# Patient Record
Sex: Female | Born: 1975 | Race: Black or African American | Hispanic: No | Marital: Married | Smoking: Never smoker
Health system: Southern US, Community
[De-identification: ages and names within clinical notes are randomized; demographics above are authoritative.]

## PROBLEM LIST (undated history)

## (undated) ENCOUNTER — Emergency Department (HOSPITAL_COMMUNITY): Payer: Self-pay | Source: Home / Self Care

## (undated) DIAGNOSIS — K7689 Other specified diseases of liver: Secondary | ICD-10-CM

## (undated) DIAGNOSIS — O24419 Gestational diabetes mellitus in pregnancy, unspecified control: Secondary | ICD-10-CM

## (undated) DIAGNOSIS — N858 Other specified noninflammatory disorders of uterus: Secondary | ICD-10-CM

## (undated) HISTORY — PX: CHOLECYSTECTOMY: SHX55

---

## 2005-09-13 ENCOUNTER — Observation Stay (HOSPITAL_COMMUNITY): Admission: RE | Admit: 2005-09-13 | Discharge: 2005-09-13 | Payer: Self-pay | Admitting: Obstetrics and Gynecology

## 2005-10-03 ENCOUNTER — Ambulatory Visit (HOSPITAL_COMMUNITY): Admission: AD | Admit: 2005-10-03 | Discharge: 2005-10-03 | Payer: Self-pay | Admitting: Obstetrics and Gynecology

## 2007-07-06 ENCOUNTER — Emergency Department (HOSPITAL_COMMUNITY): Admission: EM | Admit: 2007-07-06 | Discharge: 2007-07-06 | Payer: Self-pay | Admitting: Emergency Medicine

## 2007-08-25 ENCOUNTER — Encounter (HOSPITAL_COMMUNITY): Admission: RE | Admit: 2007-08-25 | Discharge: 2007-09-24 | Payer: Self-pay | Admitting: Preventative Medicine

## 2008-05-30 ENCOUNTER — Emergency Department (HOSPITAL_COMMUNITY): Admission: EM | Admit: 2008-05-30 | Discharge: 2008-05-30 | Payer: Self-pay | Admitting: Emergency Medicine

## 2010-01-30 ENCOUNTER — Emergency Department (HOSPITAL_COMMUNITY): Admission: EM | Admit: 2010-01-30 | Discharge: 2010-01-30 | Payer: Self-pay | Admitting: Emergency Medicine

## 2010-06-05 LAB — URINE MICROSCOPIC-ADD ON

## 2010-06-05 LAB — URINALYSIS, ROUTINE W REFLEX MICROSCOPIC
Glucose, UA: NEGATIVE mg/dL
Hgb urine dipstick: NEGATIVE
Specific Gravity, Urine: 1.025 (ref 1.005–1.030)
pH: 6.5 (ref 5.0–8.0)

## 2010-06-05 LAB — PREGNANCY, URINE: Preg Test, Ur: NEGATIVE

## 2010-06-05 LAB — GC/CHLAMYDIA PROBE AMP, GENITAL
Chlamydia, DNA Probe: NEGATIVE
GC Probe Amp, Genital: NEGATIVE

## 2010-06-05 LAB — WET PREP, GENITAL
Trich, Wet Prep: NONE SEEN
WBC, Wet Prep HPF POC: NONE SEEN

## 2010-07-05 LAB — WET PREP, GENITAL
Clue Cells Wet Prep HPF POC: NONE SEEN
Trich, Wet Prep: NONE SEEN

## 2010-07-05 LAB — PREGNANCY, URINE: Preg Test, Ur: POSITIVE

## 2010-07-05 LAB — URINALYSIS, ROUTINE W REFLEX MICROSCOPIC
Glucose, UA: NEGATIVE mg/dL
Hgb urine dipstick: NEGATIVE
Ketones, ur: NEGATIVE mg/dL
Nitrite: NEGATIVE
Protein, ur: NEGATIVE mg/dL
Urobilinogen, UA: 0.2 mg/dL (ref 0.0–1.0)
pH: 5.5 (ref 5.0–8.0)

## 2010-07-05 LAB — GC/CHLAMYDIA PROBE AMP, GENITAL: Chlamydia, DNA Probe: NEGATIVE

## 2010-07-05 LAB — RPR: RPR Ser Ql: NONREACTIVE

## 2010-07-05 LAB — SAMPLE TO BLOOD BANK

## 2010-08-10 NOTE — H&P (Signed)
NAMEMIAA, LATTERELL           ACCOUNT NO.:  [id number]   MEDICAL RECORD NO.:  [id number]          PATIENT TYPE:  OIB   LOCATION:  LDR4                          FACILITY:  [facility]   PHYSICIAN:  Tilda Burrow, M.D. DATE OF BIRTH:  09-24-1975   DATE OF ADMISSION:  09/13/2005  DATE OF DISCHARGE:  LH                                HISTORY & PHYSICAL   ADMITTING DIAGNOSIS:  Pregnancy, [redacted] weeks gestation.  Abdominal pain, rule  out preterm labor.   TIME OF ADMISSION:  6 a.m.   OBSERVATION TIME:  12 hours.   OBSERVATION SUMMARY:  This 35 year old gravida 2, para 1 at 30+ weeks  gestation was seen after presenting from her work site where she had been  contracting since midnight.  She works third shift.  She presents with mild  contractions noted, perceived as uncomfortable to the patient.  Cervical  exam is closed, internal os, soft external os.  Vertex presenting part  confirmed.   HOSPITAL COURSE:  The patient was observed through the day and ended up with  receiving terbutaline subcutaneously x2, followed by oral terbutaline.  She  had resolution with contractions.  Ultrasound performed showed estimated  fetal weight of 1774 gm, between and 50th and 95th percentile at [redacted] weeks  gestation.  Fetal survey was negative.  Cervical length was 1.7 cm on  transabdominal ultrasound.  The cervix was described as slightly dilated  with fluid containment within.  This patient was felt stable for discharge  at 6 p.m. for follow up next week with Dr. Wallace Cullens in [place].  A phone call  to the covering physician was performed on Friday afternoon to confirm  management plan, which consists of Brethine 5 mg p.o. q.6h. throughout the  weekend, and then as needed.  Follow up the first of the week with Dr.  Wallace Cullens.      Tilda Burrow, M.D.  Electronically Signed     JVF/MEDQ  D:  09/13/2005  T:  09/13/2005  Job:  [id number]

## 2010-08-10 NOTE — Group Therapy Note (Signed)
NAMEGURTHA, PICKER           ACCOUNT NO.:  [id number]   MEDICAL RECORD NO.:  [id number]          PATIENT TYPE:  OIB   LOCATION:  LDR5                          FACILITY:  [facility]   PHYSICIAN:  Tilda Burrow, M.D. DATE OF BIRTH:  [birth year]   DATE OF PROCEDURE:  DATE OF DISCHARGE:                                   PROGRESS NOTE   Jillian Berry is a patient of [place]'s.  Came into labor and delivery via EMS  with complaints of having to push.  She is [redacted] weeks pregnant.  She was 1 cm,  50% effaced, -1 station per R.N. examination.  She has been on terbutaline  about every four to six hours.  She had taken one a little while earlier.  However, while she was at work she felt like she was having contractions and  about to deliver.  Upon realizing that she was not about to deliver and some  calming down by the nurse she calmed down immediately.  No uterine activity  was noted.  She feels like maybe her terbutaline just kicked in.  She had  complained of some leaking of fluid.  A sterile speculum examination  revealed yeasty discharge which was positive yeast on a wet prep, negative  fern on a slide.   IMPRESSION:  1.  Intrauterine pregnancy at 32 weeks.  2.  Preterm contractions resolved with p.o. terbutaline.  3.  Yeast infection.   PLAN:  Patient is discharged home with a prescription for Diflucan and to  follow up with her obstetrician.      Jacklyn Shell, C.N.M.      Tilda Burrow, M.D.  Electronically Signed    FC/MEDQ  D:  10/03/2005  T:  10/03/2005  Job:  [id number]   cc:   [facility]

## 2012-02-12 ENCOUNTER — Encounter (HOSPITAL_COMMUNITY): Payer: Self-pay | Admitting: *Deleted

## 2012-02-12 ENCOUNTER — Emergency Department (HOSPITAL_COMMUNITY)
Admission: EM | Admit: 2012-02-12 | Discharge: 2012-02-12 | Disposition: A | Discharge status: Home / Self Care | Payer: 59 | Attending: Emergency Medicine | Admitting: Emergency Medicine

## 2012-02-12 DIAGNOSIS — R61 Generalized hyperhidrosis: Secondary | ICD-10-CM | POA: Insufficient documentation

## 2012-02-12 DIAGNOSIS — R55 Syncope and collapse: Secondary | ICD-10-CM | POA: Insufficient documentation

## 2012-02-12 DIAGNOSIS — E162 Hypoglycemia, unspecified: Secondary | ICD-10-CM | POA: Insufficient documentation

## 2012-02-12 DIAGNOSIS — R1110 Vomiting, unspecified: Secondary | ICD-10-CM | POA: Insufficient documentation

## 2012-02-12 HISTORY — DX: Gestational diabetes mellitus in pregnancy, unspecified control: O24.419

## 2012-02-12 LAB — CBC WITH DIFFERENTIAL/PLATELET
Basophils Absolute: 0 10*3/uL (ref 0.0–0.1)
Basophils Relative: 0 % (ref 0–1)
Eosinophils Absolute: 0.1 10*3/uL (ref 0.0–0.7)
Eosinophils Relative: 1 % (ref 0–5)
MCH: 28.1 pg (ref 26.0–34.0)
Monocytes Absolute: 0.6 10*3/uL (ref 0.1–1.0)
Neutrophils Relative %: 61 % (ref 43–77)

## 2012-02-12 LAB — BASIC METABOLIC PANEL
BUN: 12 mg/dL (ref 6–23)
Calcium: 9.5 mg/dL (ref 8.4–10.5)
Chloride: 104 mEq/L (ref 96–112)
Creatinine, Ser: 0.69 mg/dL (ref 0.50–1.10)
GFR calc Af Amer: >90 mL/min (ref >90)
GFR calc non Af Amer: >90 mL/min (ref >90)
Glucose, Bld: 67 mg/dL — ABNORMAL LOW (ref 70–99)
Sodium: 139 mEq/L (ref 135–145)

## 2012-02-12 LAB — PREGNANCY, URINE: Preg Test, Ur: NEGATIVE

## 2012-02-12 LAB — URINALYSIS, ROUTINE W REFLEX MICROSCOPIC
Glucose, UA: NEGATIVE mg/dL
Hgb urine dipstick: NEGATIVE
Leukocytes, UA: NEGATIVE
Nitrite: NEGATIVE
Protein, ur: NEGATIVE mg/dL
Specific Gravity, Urine: 1.02 (ref 1.005–1.030)
pH: 7 (ref 5.0–8.0)

## 2012-02-12 MED ORDER — SODIUM CHLORIDE 0.9 % IV BOLUS (SEPSIS)
1000.0000 mL | Freq: Once | INTRAVENOUS | Status: AC
  Administered 2012-02-12: 1000 mL via INTRAVENOUS

## 2012-02-12 NOTE — Discharge Instructions (Signed)
Tests were normal with the exception of glucose was low.   You must eat regular frequent meals including breakfast

## 2012-02-12 NOTE — ED Provider Notes (Signed)
History   This chart was scribed for Jillian Hutching, MD by Thad Ranger, ED Scribe. This patient was seen in room APA11/APA11 and the patient's care was started at 12:42 PM.  CSN: [id number]  Arrival date & time 02/12/12  1157   None     Chief Complaint  Patient presents with  . Weakness   The history is provided by the patient and a friend. No language interpreter was used.   Jillian Berry is a 36 y.o. female with a history of gestational diabetes who presents to the Emergency Department complaining of sudden, moderate to severe, intermittent episodes of generalized weakness and dizziness onset 1 week ago. There is associated vomiting, HA, and excessive sweating. She states that she fell last week when her symptoms first started. Patient states that she had similar symptoms today and she fell at work, she was told by the nurse to have some crackers otherwise she did not eat anything else. She denies having breakfast in the mornings and her first meal is usually lunch around 2 pm. Patient otherwise is healthy and denies any other symptoms.    Past Medical History  Diagnosis Date  . Gestational diabetes     History reviewed. No pertinent past surgical history.  History reviewed. No pertinent family history.  History  Substance Use Topics  . Smoking status: Never Smoker   . Smokeless tobacco: Not on file  . Alcohol Use: No    No OB history provided.  Review of Systems  A complete 10 system review of systems was obtained and all systems are negative except as noted in the HPI and PMH.   Allergies  Flagyl and Penicillins  Home Medications  No current outpatient prescriptions on file.  BP 121/85  Pulse 67  Temp 97.7 F (36.5 C) (Oral)  Resp 20  Ht 5\' 5"  (1.651 m)  Wt 180 lb (81.647 kg)  BMI 29.95 kg/m2  SpO2 100%  LMP 01/12/2012  Physical Exam  Nursing note and vitals reviewed. Constitutional: She is oriented to person, place, and time. She appears  well-developed and well-nourished.  HENT:  Head: Normocephalic and atraumatic.  Eyes: Conjunctivae normal and EOM are normal. Pupils are equal, round, and reactive to light.  Neck: Normal range of motion. Neck supple.  Cardiovascular: Normal rate, regular rhythm and normal heart sounds.   Pulmonary/Chest: Effort normal and breath sounds normal.  Abdominal: Soft. Bowel sounds are normal.  Musculoskeletal: Normal range of motion.  Neurological: She is alert and oriented to person, place, and time.  Skin: Skin is warm and dry.  Psychiatric: She has a normal mood and affect.    ED Course  Procedures (including critical care time)  DIAGNOSTIC STUDIES: Oxygen Saturation is 100% on room air, normal by my interpretation.    COORDINATION OF CARE: 12:59 PM Discussed the possibility of having syncope and also discussed treatment plan which includes EKG and blood work with pt at bedside and pt agreed to plan.  Labs Reviewed  BASIC METABOLIC PANEL - Abnormal; Notable for the following:    Glucose, Bld 67 (*)     All other components within normal limits  CBC WITH DIFFERENTIAL  URINALYSIS, ROUTINE W REFLEX MICROSCOPIC  PREGNANCY, URINE  GLUCOSE, CAPILLARY   No results found.   No diagnosis found.   Date: 02/12/2012  Rate: 69  Rhythm: normal sinus rhythm  QRS Axis: normal  Intervals: normal  ST/T Wave abnormalities: normal  Conduction Disutrbances: none  Narrative Interpretation: unremarkable  c SA   MDM  History and physical exam normal. EKG normal. Screening labs showed glucose of 67. Patient does not eat  breakfast. I recommended she eat regular frequent meals and increase fluids      I personally performed the services described in this documentation, which was scribed in my presence. The recorded information has been reviewed and is accurate.     Jillian Hutching, MD 02/12/12 1452

## 2012-02-12 NOTE — ED Notes (Signed)
Dizziness, weakness, had onset app 1 week ago, felt weak, vomiting, and fell to floor.  Today at work had dizziness again and nurse checked her cbg, and gave her peanut butter.  Headache, and dizziness now.  Had gestestional diabetes.

## 2013-11-18 ENCOUNTER — Other Ambulatory Visit (HOSPITAL_COMMUNITY): Payer: Self-pay | Admitting: Physician Assistant

## 2013-11-18 DIAGNOSIS — N949 Unspecified condition associated with female genital organs and menstrual cycle: Secondary | ICD-10-CM

## 2013-11-23 ENCOUNTER — Ambulatory Visit (HOSPITAL_COMMUNITY)
Admission: RE | Admit: 2013-11-23 | Discharge: 2013-11-23 | Disposition: A | Discharge status: Home / Self Care | Payer: BC Managed Care – PPO | Source: Ambulatory Visit | Attending: Physician Assistant | Admitting: Physician Assistant

## 2013-11-23 ENCOUNTER — Other Ambulatory Visit (HOSPITAL_COMMUNITY): Payer: Self-pay | Admitting: Physician Assistant

## 2013-11-23 DIAGNOSIS — N949 Unspecified condition associated with female genital organs and menstrual cycle: Secondary | ICD-10-CM | POA: Insufficient documentation

## 2013-12-02 ENCOUNTER — Other Ambulatory Visit (HOSPITAL_COMMUNITY): Payer: Self-pay | Admitting: Physician Assistant

## 2013-12-02 DIAGNOSIS — N9489 Other specified conditions associated with female genital organs and menstrual cycle: Secondary | ICD-10-CM

## 2013-12-06 ENCOUNTER — Ambulatory Visit (HOSPITAL_COMMUNITY)
Admission: RE | Admit: 2013-12-06 | Discharge: 2013-12-06 | Disposition: A | Discharge status: Home / Self Care | Payer: BC Managed Care – PPO | Source: Ambulatory Visit | Attending: Physician Assistant | Admitting: Physician Assistant

## 2013-12-06 DIAGNOSIS — N9489 Other specified conditions associated with female genital organs and menstrual cycle: Secondary | ICD-10-CM

## 2013-12-06 DIAGNOSIS — K7689 Other specified diseases of liver: Secondary | ICD-10-CM | POA: Diagnosis not present

## 2013-12-06 DIAGNOSIS — N838 Other noninflammatory disorders of ovary, fallopian tube and broad ligament: Secondary | ICD-10-CM | POA: Diagnosis not present

## 2013-12-06 DIAGNOSIS — R109 Unspecified abdominal pain: Secondary | ICD-10-CM | POA: Diagnosis present

## 2013-12-06 MED ORDER — IOHEXOL 300 MG/ML  SOLN
100.0000 mL | Freq: Once | INTRAMUSCULAR | Status: AC | PRN
  Administered 2013-12-06: 100 mL via INTRAVENOUS

## 2015-09-11 ENCOUNTER — Emergency Department (HOSPITAL_COMMUNITY): Payer: BLUE CROSS/BLUE SHIELD

## 2015-09-11 ENCOUNTER — Encounter (HOSPITAL_COMMUNITY): Payer: Self-pay | Admitting: Emergency Medicine

## 2015-09-11 ENCOUNTER — Emergency Department (HOSPITAL_COMMUNITY)
Admission: EM | Admit: 2015-09-11 | Discharge: 2015-09-12 | Discharge status: Against Medical Advice | Payer: BLUE CROSS/BLUE SHIELD | Attending: Emergency Medicine | Admitting: Emergency Medicine

## 2015-09-11 DIAGNOSIS — R109 Unspecified abdominal pain: Secondary | ICD-10-CM

## 2015-09-11 DIAGNOSIS — R1084 Generalized abdominal pain: Secondary | ICD-10-CM | POA: Insufficient documentation

## 2015-09-11 DIAGNOSIS — K5900 Constipation, unspecified: Secondary | ICD-10-CM | POA: Insufficient documentation

## 2015-09-11 HISTORY — DX: Other specified diseases of liver: K76.89

## 2015-09-11 HISTORY — DX: Other specified noninflammatory disorders of uterus: N85.8

## 2015-09-11 LAB — URINALYSIS, ROUTINE W REFLEX MICROSCOPIC
GLUCOSE, UA: NEGATIVE mg/dL
Hgb urine dipstick: NEGATIVE
KETONES UR: NEGATIVE mg/dL
LEUKOCYTES UA: NEGATIVE
Nitrite: NEGATIVE
PH: 6 (ref 5.0–8.0)
Protein, ur: 30 mg/dL — AB
Specific Gravity, Urine: >1.03 — ABNORMAL HIGH (ref 1.005–1.030)

## 2015-09-11 LAB — URINE MICROSCOPIC-ADD ON

## 2015-09-11 LAB — POC URINE PREG, ED: Preg Test, Ur: NEGATIVE

## 2015-09-11 NOTE — ED Notes (Signed)
Pt c/o constipation with a small bowel movement yesterday. Pt has history of the same.

## 2015-09-11 NOTE — ED Notes (Signed)
Pt c/o abd pain/n and constipation x 2 weeks.

## 2015-09-11 NOTE — ED Provider Notes (Signed)
History  By signing my name below, I, Earmon Phoenix, attest that this documentation has been prepared under the direction and in the presence of Zadie Rhine, MD. Electronically Signed: Earmon Phoenix, ED Scribe. 09/11/2015. 11:27 AM.  Chief Complaint  Patient presents with  . Abdominal Pain   Patient is a 40 y.o. female presenting with abdominal pain. The history is provided by the patient and medical records. No language interpreter was used.  Abdominal Pain Pain location:  Generalized Pain quality: fullness   Pain quality comment:  Tightness Pain radiates to:  Does not radiate Pain severity:  Moderate Onset quality:  Gradual Duration:  2 weeks Timing:  Constant Progression:  Unchanged Chronicity:  Recurrent Context: previous surgery   Relieved by:  Nothing Ineffective treatments:  Bowel activity Associated symptoms: constipation   Risk factors: obesity     HPI Comments:  Jillian Berry is a 40 y.o. obese female who presents to the Emergency Department complaining of lower abdominal pain and upper abdominal tightness that began about two weeks ago. She reports associated constipation, nausea and bloating. Pt reports having a cholecystectomy about 1.5 years ago and tried to contact her Careers adviser. She reports having a bowel movement about three days ago but states it was really hard in consistency and small in amount. She reports taking OTC stool softeners with no significant relief of the symptoms. She denies modifying factors. She denies fever, vomiting, dysuria, vaginal bleeding or discharge, cough. She reports she has cysts on her ovaries and one on her liver. She denies any other abdominal surgeries.   Past Medical History  Diagnosis Date  . Liver cyst   . Uterine cyst    Past Surgical History  Procedure Laterality Date  . Cholecystectomy     No family history on file. Social History  Substance Use Topics  . Smoking status: Never Smoker   . Smokeless  tobacco: None  . Alcohol Use: No   OB History    No data available     Review of Systems  Gastrointestinal: Positive for abdominal pain and constipation.  All other systems reviewed and are negative.   Allergies  Flagyl and Penicillins  Home Medications   Prior to Admission medications   Not on File   Triage Vitals: BP 134/89 mmHg  Pulse 75  Temp(Src) 98.5 F (36.9 C)  Resp 18  Ht  (1.651 m)  Wt 200 lb (90.719 kg)  BMI 33.28 kg/m2  SpO2 99%  LMP 08/27/2015  Physical Exam  CONSTITUTIONAL: Well developed/well nourished HEAD: Normocephalic/atraumatic EYES: EOMI ENMT: Mucous membranes moist NECK: supple no meningeal signs SPINE/BACK:entire spine nontender CV: S1/S2 noted, no murmurs/rubs/gallops noted LUNGS: Lungs are clear to auscultation bilaterally, no apparent distress ABDOMEN: soft, nontender, no rebound or guarding, bowel sounds noted throughout abdomen GU:no cva tenderness; pt declines rectal exam NEURO: Pt is awake/alert/appropriate, moves all extremitiesx4.  No facial droop.   EXTREMITIES: pulses normal/equal, full ROM SKIN: warm, color normal PSYCH: no abnormalities of mood noted, alert and oriented to situation  ED Course  Procedures  DIAGNOSTIC STUDIES: Oxygen Saturation is 99% on RA, normal by my interpretation.   COORDINATION OF CARE: 10:38 AM- Will order imaging, urinalysis. Offered to perform rectal exam to check for impaction but pt declined. Pt verbalizes understanding and agrees to plan.   Pt left after initial evaluation We were unable to complete abdominal xray Pt was otherwise stable in the ER  Labs Review Labs Reviewed  URINALYSIS, ROUTINE W REFLEX MICROSCOPIC (NOT  AT [facility]) - Abnormal; Notable for the following:    Specific Gravity, Urine >1.030 (*)    Bilirubin Urine SMALL (*)    Protein, ur 30 (*)    All other components within normal limits  URINE MICROSCOPIC-ADD ON - Abnormal; Notable for the following:    Squamous  Epithelial / LPF TOO NUMEROUS TO COUNT (*)    Bacteria, UA MANY (*)    All other components within normal limits  POC URINE PREG, ED    I have personally reviewed and evaluated these lab results as part of my medical decision-making.    MDM   Final diagnoses:  Abdominal pain, unspecified abdominal location    Nursing notes including past medical history and social history reviewed and considered in documentation Labs/vital reviewed myself and considered during evaluation   I personally performed the services described in this documentation, which was scribed in my presence. The recorded information has been reviewed and is accurate.       Zadie Rhineonald Kaelan Emami, MD 09/11/15 1242

## 2015-10-31 ENCOUNTER — Telehealth: Payer: Self-pay | Admitting: Orthopedic Surgery

## 2015-10-31 NOTE — Telephone Encounter (Signed)
Patient called to inquire about scheduling appointment for carpel tunnel, as states she was treated at [facility] urgent care - states it was deemed as NOT workers comp or work-related; also states she was told to have Nerve conduction study in [place], and that it's been done.  Relayed that our office will need a referral from providers who have participated in her care to determine that this is needed, so that we may receive treatment notes and reports.  Appointment pending.

## 2015-11-15 ENCOUNTER — Encounter (HOSPITAL_COMMUNITY): Payer: Self-pay | Admitting: *Deleted

## 2016-10-18 ENCOUNTER — Encounter: Payer: Self-pay | Admitting: Emergency Medicine

## 2016-10-18 ENCOUNTER — Emergency Department (HOSPITAL_COMMUNITY): Payer: BLUE CROSS/BLUE SHIELD

## 2016-10-18 ENCOUNTER — Emergency Department (HOSPITAL_COMMUNITY)
Admission: EM | Admit: 2016-10-18 | Discharge: 2016-10-18 | Disposition: A | Discharge status: Home / Self Care | Payer: BLUE CROSS/BLUE SHIELD | Attending: Emergency Medicine | Admitting: Emergency Medicine

## 2016-10-18 DIAGNOSIS — R55 Syncope and collapse: Secondary | ICD-10-CM | POA: Insufficient documentation

## 2016-10-18 DIAGNOSIS — R42 Dizziness and giddiness: Secondary | ICD-10-CM | POA: Diagnosis not present

## 2016-10-18 DIAGNOSIS — R002 Palpitations: Secondary | ICD-10-CM | POA: Diagnosis not present

## 2016-10-18 LAB — BASIC METABOLIC PANEL
ANION GAP: 9 (ref 5–15)
BUN: 12 mg/dL (ref 6–20)
CHLORIDE: 105 mmol/L (ref 101–111)
CO2: 23 mmol/L (ref 22–32)
CREATININE: 0.75 mg/dL (ref 0.44–1.00)
Calcium: 8.9 mg/dL (ref 8.9–10.3)
GFR calc non Af Amer: >60 mL/min (ref >60)
Glucose, Bld: 108 mg/dL — ABNORMAL HIGH (ref 65–99)
Potassium: 3.8 mmol/L (ref 3.5–5.1)
SODIUM: 137 mmol/L (ref 135–145)

## 2016-10-18 LAB — CBG MONITORING, ED: GLUCOSE-CAPILLARY: 120 mg/dL — AB (ref 65–99)

## 2016-10-18 LAB — TROPONIN I: Troponin I: <0.03 ng/mL (ref <0.03)

## 2016-10-18 LAB — CBC
HCT: 37.5 % (ref 36.0–46.0)
Hemoglobin: 12.2 g/dL (ref 12.0–15.0)
MCH: 27.2 pg (ref 26.0–34.0)
MCHC: 32.5 g/dL (ref 30.0–36.0)
MCV: 83.5 fL (ref 78.0–100.0)
PLATELETS: 229 10*3/uL (ref 150–400)
RBC: 4.49 MIL/uL (ref 3.87–5.11)
RDW: 14.9 % (ref 11.5–15.5)
WBC: 9.2 10*3/uL (ref 4.0–10.5)

## 2016-10-18 LAB — URINALYSIS, ROUTINE W REFLEX MICROSCOPIC
Bilirubin Urine: NEGATIVE
Glucose, UA: NEGATIVE mg/dL
Hgb urine dipstick: NEGATIVE
Ketones, ur: NEGATIVE mg/dL
LEUKOCYTES UA: NEGATIVE
NITRITE: NEGATIVE
PROTEIN: NEGATIVE mg/dL
Specific Gravity, Urine: 1.006 (ref 1.005–1.030)
pH: 6 (ref 5.0–8.0)

## 2016-10-18 LAB — I-STAT BETA HCG BLOOD, ED (MC, WL, AP ONLY)

## 2016-10-18 LAB — D-DIMER, QUANTITATIVE: D-Dimer, Quant: 0.93 ug/mL-FEU — ABNORMAL HIGH (ref 0.00–0.50)

## 2016-10-18 MED ORDER — MECLIZINE HCL 25 MG PO TABS: 25.0000 mg | ORAL_TABLET | Freq: Three times a day (TID) | ORAL | 0 refills | Status: AC | PRN

## 2016-10-18 MED ORDER — SODIUM CHLORIDE 0.9 % IV BOLUS (SEPSIS)
1000.0000 mL | Freq: Once | INTRAVENOUS | Status: AC
  Administered 2016-10-18: 1000 mL via INTRAVENOUS

## 2016-10-18 MED ORDER — ONDANSETRON 4 MG PO TBDP
4.0000 mg | ORAL_TABLET | Freq: Once | ORAL | Status: AC
  Administered 2016-10-18: 4 mg via ORAL
  Filled 2016-10-18: qty 1

## 2016-10-18 MED ORDER — MECLIZINE HCL 12.5 MG PO TABS
25.0000 mg | ORAL_TABLET | Freq: Once | ORAL | Status: AC
  Administered 2016-10-18: 25 mg via ORAL
  Filled 2016-10-18: qty 2

## 2016-10-18 MED ORDER — IOPAMIDOL (ISOVUE-370) INJECTION 76%
100.0000 mL | Freq: Once | INTRAVENOUS | Status: AC | PRN
  Administered 2016-10-18: 100 mL via INTRAVENOUS

## 2016-10-18 MED ORDER — ONDANSETRON 4 MG PO TBDP: 4.0000 mg | ORAL_TABLET | Freq: Three times a day (TID) | ORAL | 0 refills | Status: AC | PRN

## 2016-10-18 NOTE — Discharge Instructions (Signed)
Avoid avoid caffinated products, such as teas, colas, coffee, chocolate. Avoid over the counter cold medicines, herbal or "natural vitamin" products, and illicit drugs because they can contain stimulants.  Call your regular medical doctor today to schedule a follow up appointment within the next 3 days. You may need a home monitor ("Holter monitor") to monitor you for a fast heart rate.  Call the Cardiologist today to schedule a follow up appointment within the next week to further discuss this. Return to the Emergency Department immediately if worsening.

## 2016-10-18 NOTE — ED Notes (Signed)
Patient transported to X-ray 

## 2016-10-18 NOTE — ED Notes (Signed)
Ambulated Pt to bathroom, pt did well on the way to the restoroom, on the way back once standing from toliet pt became tearful and felt nauseated and sick. HR went up to 110. She reports that she feels like she is going to fall and that "I just feel sick, I don't know how, just sick like I may fall"

## 2016-10-18 NOTE — ED Triage Notes (Addendum)
Presents with near syncopal episode-per pt this is the 3rd episode this week, this episode occurred while coming in to work from a break. . PT states she feels her rate go up and race and then she fels faint. HR for EMS is 80s. Normotensive. AT thsi time pt reports she just feels bad. She is tearful and reports that still feels like her hear is racing.

## 2016-10-18 NOTE — ED Provider Notes (Signed)
[facility]-EM[facility]Y DEPT Provider Note   CSN: [id number] Arrival date & time: 10/18/16  1029     History   Chief Complaint Chief Complaint  Patient presents with  . Near Syncope    HPI Jillian Berry is a 41 y.o. female.  HPI  Pt was seen at 1115. Per EMS and pt, c/o gradual onset and persistence of several intermittent episodes of palpitations for the past week. Pt states she "feels her heart race" and then "feels faint." Pt states she had symptoms today while coming into work from a break, and has had the same symptoms "in the middle of the night."  EMS states pt was normotensive and HR 80's. Denies CP/SOB, no cough, no abd pain, no N/V/D, no back pain, no focal motor weakness, no tingling/numbness in extremities.   Past Medical History:  Diagnosis Date  . Gestational diabetes   . Liver cyst   . Uterine cyst     There are no active problems to display for this patient.   Past Surgical History:  Procedure Laterality Date  . CHOLECYSTECTOMY      OB History    No data available       Home Medications    Prior to Admission medications   Medication Sig Start Date End Date Taking? Authorizing Provider  TRI-PREVIFEM 0.18/0.215/0.25 MG-35 MCG tablet Take 1 tablet by mouth daily. 10/03/16  Yes [provider]    Family History History reviewed. No pertinent family history.  Social History Social History  Substance Use Topics  . Smoking status: Never Smoker  . Smokeless tobacco: Not on file  . Alcohol use No     Allergies   Flagyl [metronidazole] and Penicillins   Review of Systems Review of Systems ROS: Statement: All systems negative except as marked or noted in the HPI; Constitutional: Negative for fever and chills. ; ; Eyes: Negative for eye pain, redness and discharge. ; ; ENMT: Negative for ear pain, hoarseness, nasal congestion, sinus pressure and sore throat. ; ; Cardiovascular: +palpitations. Negative for chest pain, diaphoresis, dyspnea and  peripheral edema. ; ; Respiratory: Negative for cough, wheezing and stridor. ; ; Gastrointestinal: Negative for nausea, vomiting, diarrhea, abdominal pain, blood in stool, hematemesis, jaundice and rectal bleeding. . ; ; Genitourinary: Negative for dysuria, flank pain and hematuria. ; ; Musculoskeletal: Negative for back pain and neck pain. Negative for swelling and trauma.; ; Skin: Negative for pruritus, rash, abrasions, blisters, bruising and skin lesion.; ; Neuro: +lightheadedness. Negative for headache and neck stiffness. Negative for weakness, altered level of consciousness, altered mental status, extremity weakness, paresthesias, involuntary movement, seizure and syncope.       Physical Exam Updated Vital Signs BP 130/81 (BP Location: Left Arm)   Pulse 81   Temp 98.9 F (37.2 C) (Oral)   Resp (!) 24   Ht 5\' 2"  (1.575 m)   Wt 96.2 kg (212 lb)   LMP 09/19/2016 (Exact Date)   SpO2 100%   BMI 38.78 kg/m   Physical Exam 1120: Physical examination:  Nursing notes reviewed; Vital signs and O2 SAT reviewed;  Constitutional: Well developed, Well nourished, Well hydrated, In no acute distress; Head:  Normocephalic, atraumatic; Eyes: EOMI, PERRL, No scleral icterus; ENMT: Mouth and pharynx normal, Mucous membranes moist; Neck: Supple, Full range of motion, No lymphadenopathy; Cardiovascular: Regular rate and rhythm, No gallop; Respiratory: Breath sounds clear & equal bilaterally, No wheezes.  Speaking full sentences with ease, Normal respiratory effort/excursion. Hyperventilating at times.; Chest: Nontender, Movement normal;  Abdomen: Soft, Nontender, Nondistended, Normal bowel sounds; Genitourinary: No CVA tenderness; Extremities: Pulses normal, No tenderness, No edema, No calf edema or asymmetry.; Neuro: AA&Ox3, Major CN grossly intact.  Speech clear. No gross focal motor or sensory deficits in extremities.; Skin: Color normal, Warm, Dry.; Psych:  Anxious.    ED Treatments / Results  Labs (all  labs ordered are listed, but only abnormal results are displayed)   EKG  EKG Interpretation  Date/Time:  Friday October 18 2016 10:41:47 EDT Ventricular Rate:  83 PR Interval:    QRS Duration: 88 QT Interval:  362 QTC Calculation: 426 R Axis:   76 Text Interpretation:  Sinus rhythm When compared with ECG of 02/12/2012 No significant change was found Confirmed by [facility]  MD, Nicholos Johns [id number]) on 10/18/2016 11:32:44 AM       Radiology   Procedures Procedures (including critical care time)  Medications Ordered in ED Medications  iopamidol (ISOVUE-370) 76 % injection 100 mL (100 mLs Intravenous Contrast Given 10/18/16 1216)  meclizine (ANTIVERT) tablet 25 mg (25 mg Oral Given 10/18/16 1311)  sodium chloride 0.9 % bolus 1,000 mL (1,000 mLs Intravenous New Bag/Given 10/18/16 1315)     Initial Impression / Assessment and Plan / ED Course  I have reviewed the triage vital signs and the nursing notes.  Pertinent labs & imaging results that were available during my care of the patient were reviewed by me and considered in my medical decision making (see chart for details).  MDM Reviewed: previous chart, nursing note and vitals Reviewed previous: labs and ECG Interpretation: labs, ECG, x-ray and CT scan   Results for orders placed or performed during the hospital encounter of 10/18/16  Basic metabolic panel  Result Value Ref Range   Sodium 137 135 - 145 mmol/L   Potassium 3.8 3.5 - 5.1 mmol/L   Chloride 105 101 - 111 mmol/L   CO2 23 22 - 32 mmol/L   Glucose, Bld 108 (H) 65 - 99 mg/dL   BUN 12 6 - 20 mg/dL   Creatinine, Ser 6.04 0.44 - 1.00 mg/dL   Calcium 8.9 8.9 - 54.0 mg/dL   GFR calc non Af Amer >60 >60 mL/min   GFR calc Af Amer >60 >60 mL/min   Anion gap 9 5 - 15  CBC  Result Value Ref Range   WBC 9.2 4.0 - 10.5 K/uL   RBC 4.49 3.87 - 5.11 MIL/uL   Hemoglobin 12.2 12.0 - 15.0 g/dL   HCT 98.1 19.1 - 47.8 %   MCV 83.5 78.0 - 100.0 fL   MCH 27.2 26.0 - 34.0 pg   MCHC  32.5 30.0 - 36.0 g/dL   RDW 29.5 62.1 - 30.8 %   Platelets 229 150 - 400 K/uL  Urinalysis, Routine w reflex microscopic  Result Value Ref Range   Color, Urine STRAW (A) YELLOW   APPearance CLEAR CLEAR   Specific Gravity, Urine 1.006 1.005 - 1.030   pH 6.0 5.0 - 8.0   Glucose, UA NEGATIVE NEGATIVE mg/dL   Hgb urine dipstick NEGATIVE NEGATIVE   Bilirubin Urine NEGATIVE NEGATIVE   Ketones, ur NEGATIVE NEGATIVE mg/dL   Protein, ur NEGATIVE NEGATIVE mg/dL   Nitrite NEGATIVE NEGATIVE   Leukocytes, UA NEGATIVE NEGATIVE  Troponin I  Result Value Ref Range   Troponin I <0.03 <0.03 ng/mL  D-dimer, quantitative  Result Value Ref Range   D-Dimer, Quant 0.93 (H) 0.00 - 0.50 ug/mL-FEU  CBG monitoring, ED  Result Value Ref Range  Glucose-Capillary 120 (H) 65 - 99 mg/dL   Comment 1 Notify RN    Comment 2 Document in Chart   I-Stat beta hCG blood, ED  Result Value Ref Range   I-stat hCG, quantitative <5.0 <5 mIU/mL   Comment 3           Dg Chest 2 View Result Date: 10/18/2016 CLINICAL DATA:  Near syncope, palpitations EXAM: CHEST  2 VIEW COMPARISON:  CT chest 10/18/2016 FINDINGS: The heart size and mediastinal contours are within normal limits. Both lungs are clear. The visualized skeletal structures are unremarkable. IMPRESSION: No active cardiopulmonary disease. Electronically Signed   By: Marlan Palau M.D.   On: 10/18/2016 12:51   Ct Angio Chest Pe W/cm &/or Wo Cm Result Date: 10/18/2016 CLINICAL DATA:  Near syncopal episode. Evaluate for pulmonary embolism EXAM: CT ANGIOGRAPHY CHEST WITH CONTRAST TECHNIQUE: Multidetector CT imaging of the chest was performed using the standard protocol during bolus administration of intravenous contrast. Multiplanar CT image reconstructions and MIPs were obtained to evaluate the vascular anatomy. CONTRAST:  100 cc Isovue 370 COMPARISON:  None. FINDINGS: Examination is degraded secondary to patient body habitus and associated quantum mottle artifact.  Vascular Findings: There is adequate opacification of the pulmonary arterial system with the main pulmonary artery measuring 349 Hounsfield units. There are no discrete filling defects within the pulmonary arterial tree to suggest pulmonary embolism. Normal caliber of the main pulmonary artery. Borderline cardiomegaly.  No pericardial effusion. No definite thoracic aortic aneurysm or dissection on this nongated examination. Conventional configuration of the aortic arch. The branch vessels of the aortic arch appear widely patent throughout their imaged course. Review of the MIP images confirms the above findings. ---------------------------------------------------------------------------------- Nonvascular Findings: Mediastinum/Lymph Nodes: No bulky mediastinal, hilar or axillary lymphadenopathy. There is a minimal amount of ill-defined tissue within the anterior mediastinum which is favored to represent residual thymus tissue. Lungs/Pleura: Minimal dependent ground-glass atelectasis. No discrete focal airspace opacities. No pleural effusion or pneumothorax. Central pulmonary airways appear widely patent. No discrete pulmonary nodules. A punctate (approximately 0.3 cm) perifissural lymph node is seen within the superior aspect of the right major fissure (image 37, series 6). Upper abdomen: Limited early arterial phase evaluation of the upper abdomen demonstrates a hypoattenuating (6 Hounsfield unit) lesion within the posterior aspect the right lobe of the liver, incompletely imaged, though likely represents a hepatic cyst (image 37, series 4; coronal image 89, series 7). Musculoskeletal: No acute or aggressive osseous abnormalities. Regional soft tissues appear normal. Normal appearance of the thyroid gland. IMPRESSION: No acute cardiopulmonary disease. Specifically, no evidence of pulmonary embolism. Electronically Signed   By: Simonne Come M.D.   On: 10/18/2016 12:36    1245:  Pt ambulated to bathroom, HR 110,  and told ED RN she "felt like she felt sick." Pt further describes feeling as "like I may fall" and "the floor was moving."  Pt given IVF NS bolus and PO antivert.  1415:  HR remains 80's while in the ED (despite pt stating she felt her heart racing on arrival).  Pt feels better after antivert and IVF and wants to go home now. Pt remains tearful at times; not forthcoming regarding any further information however. Pt ambulated with steady gait, easy resps, NAD. Tx symptomatically at this time. Dx and testing d/w pt and family.  Questions answered.  Verb understanding, agreeable to d/c home with outpt f/u.      Final Clinical Impressions(s) / ED Diagnoses   Final diagnoses:  None  New Prescriptions New Prescriptions   No medications on file     Samuel JesterMcManus, Jaleeah Slight, DO 10/21/16 2155

## 2019-06-04 IMAGING — CT CT ANGIO CHEST
2 of 6 series · 17 of 46 positions shown · IV contrast (Isovue)
Comparison: None.

CLINICAL DATA: Near syncopal episode. Evaluate for pulmonary
embolism

EXAM:
CT ANGIOGRAPHY CHEST WITH CONTRAST
TECHNIQUE: Multidetector CT imaging of the chest was performed using the
standard protocol during bolus administration of intravenous
contrast. Multiplanar CT image reconstructions and MIPs were
obtained to evaluate the vascular anatomy.
CONTRAST:  100 cc Isovue 370

[Series 5: thins · axial · 0.56mm/px · z∈[-259,-46]mm · 14 of 235 slices shown]
[im 11/235  lung]
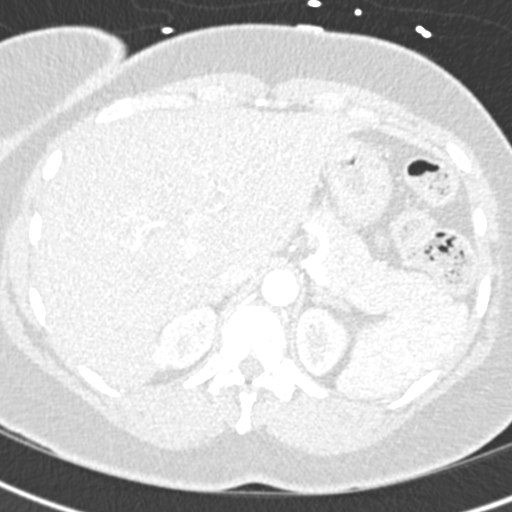
[im 31/235  soft-tissue]
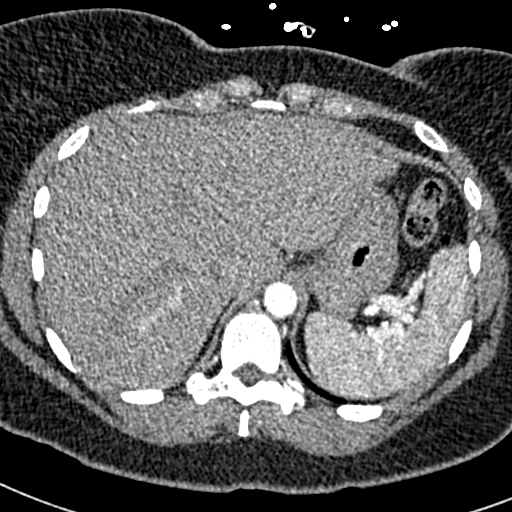
[im 41/235  lung]
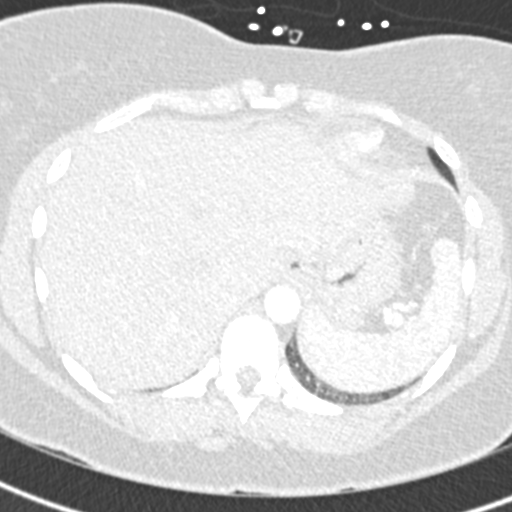
[im 62/235  soft-tissue]
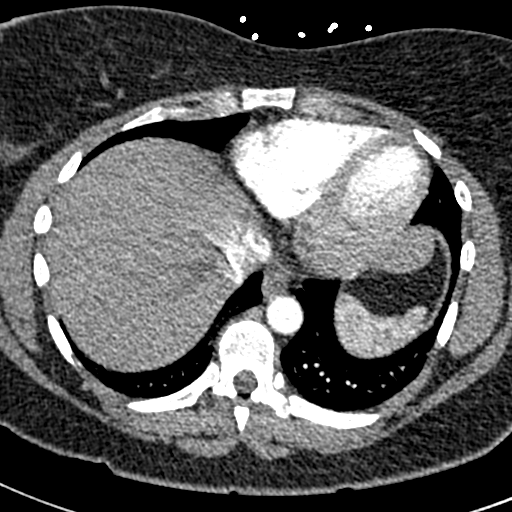
[im 82/235  lung]
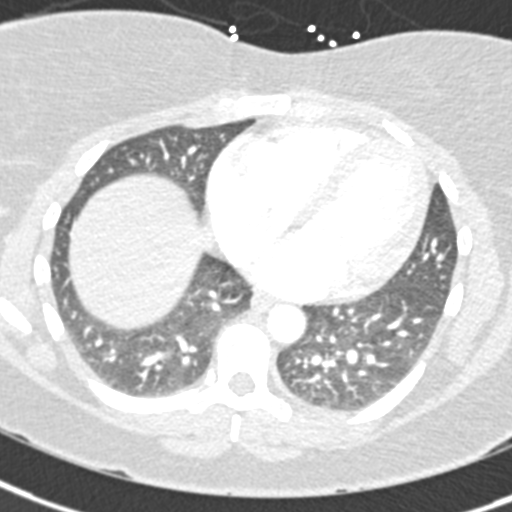
[im 92/235  soft-tissue]
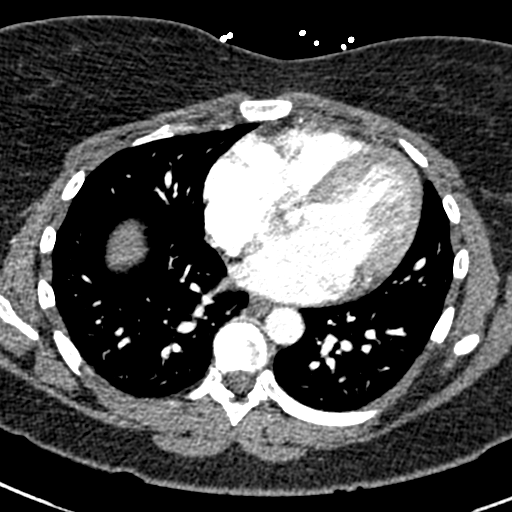
[im 112/235  lung]
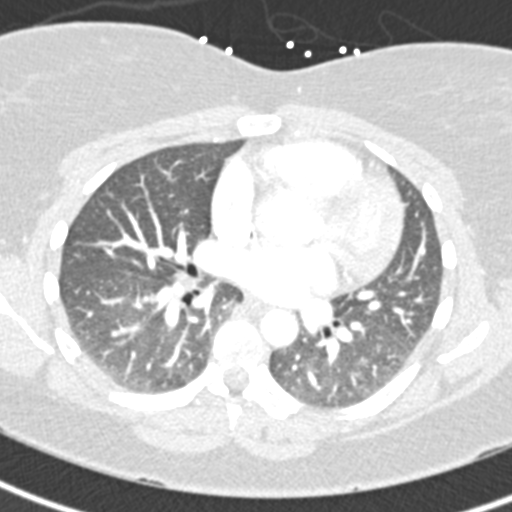
[im 123/235  soft-tissue]
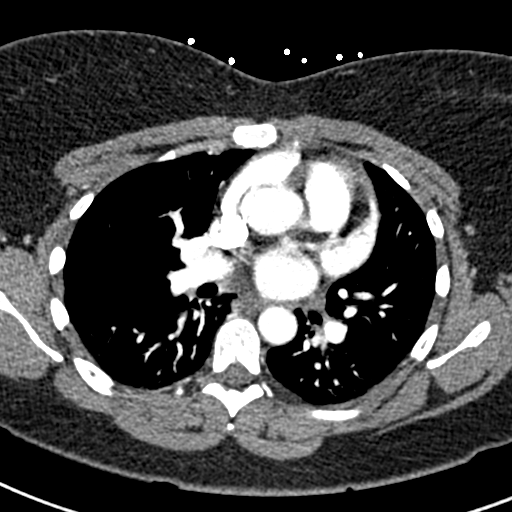
[im 143/235  lung]
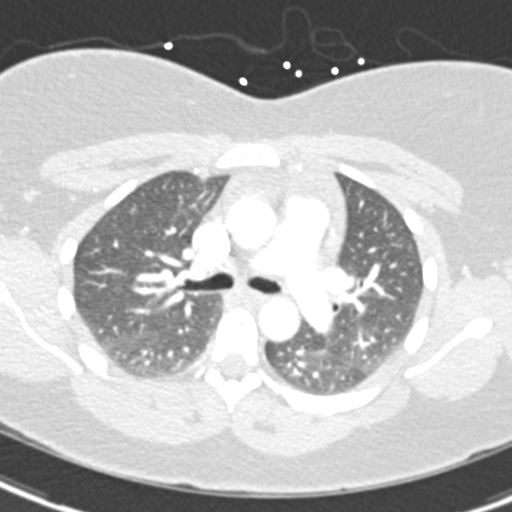
[im 153/235  soft-tissue]
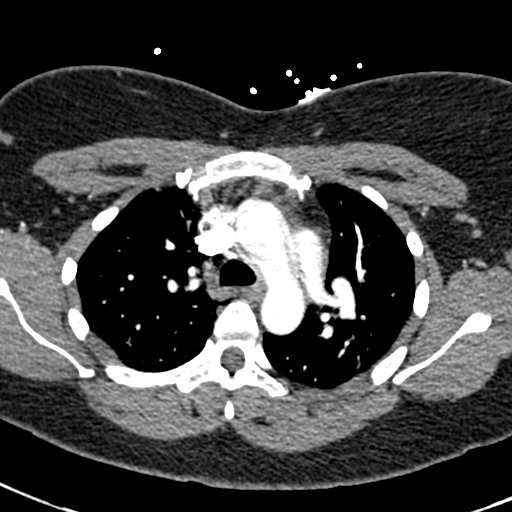
[im 173/235  lung]
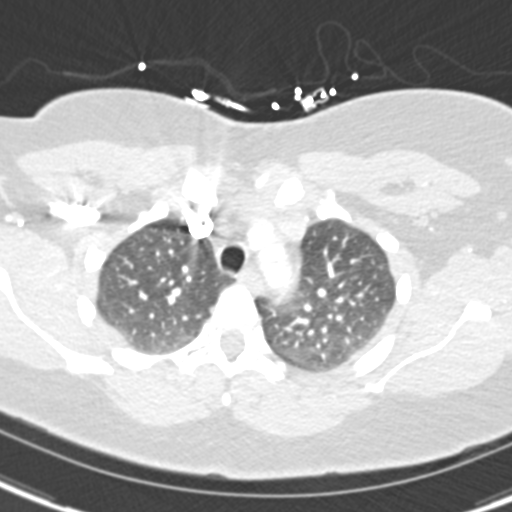
[im 194/235  soft-tissue]
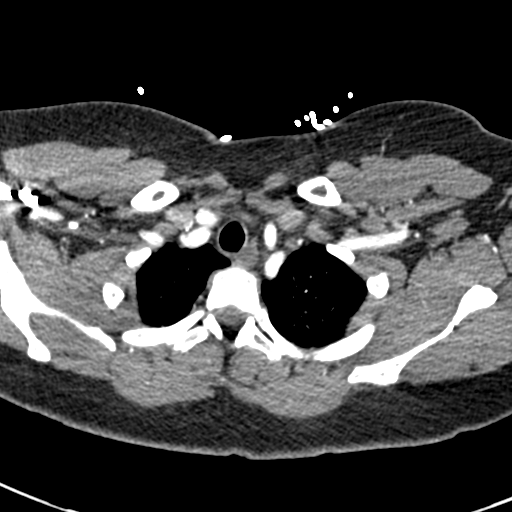
[im 204/235  lung]
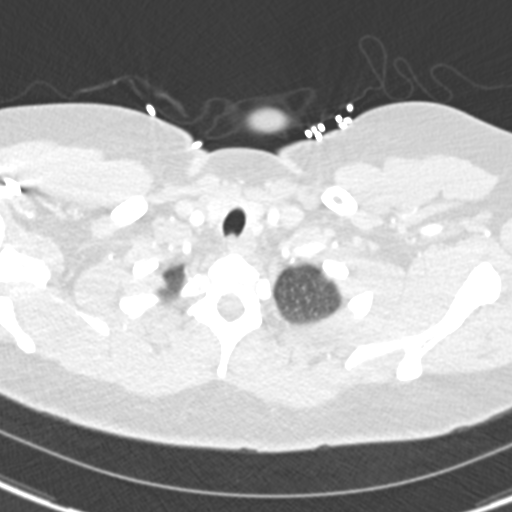
[im 224/235  soft-tissue]
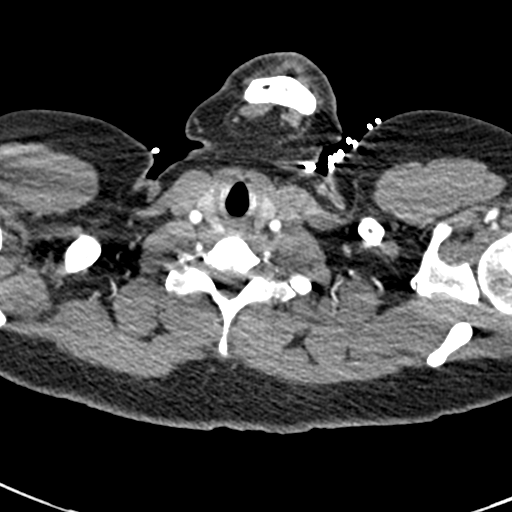

[Series 7: coronal mpr · coronal · 0.47mm/px · 3 of 116 slices shown]
[im 29/116  soft-tissue]
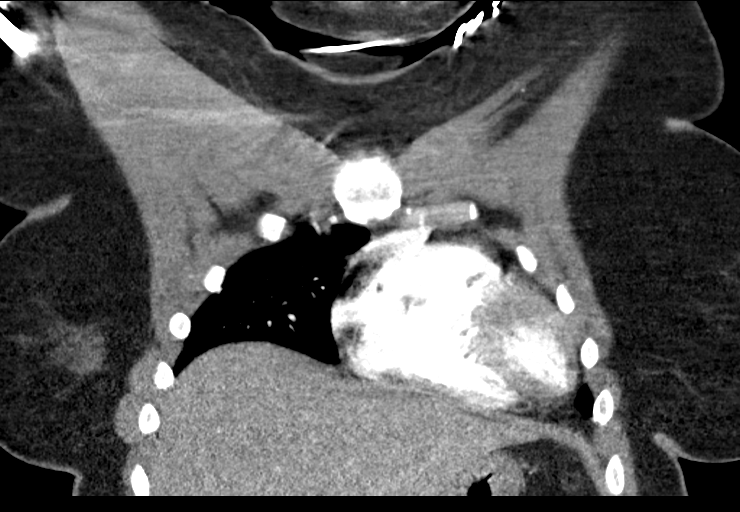
[im 58/116  soft-tissue]
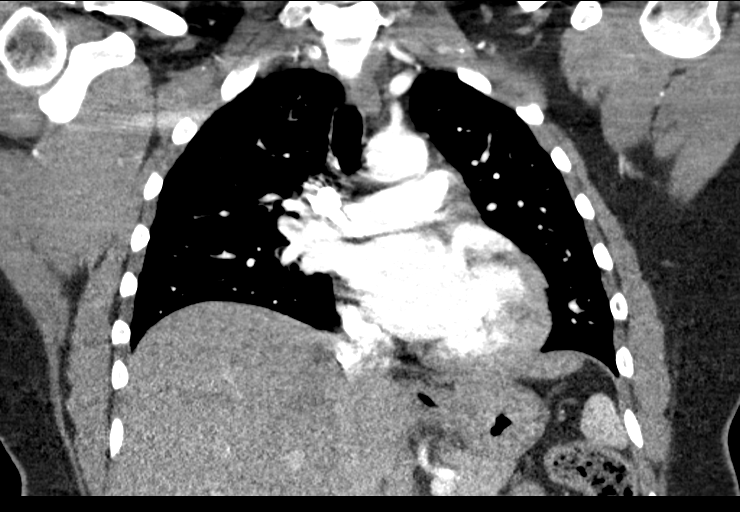
[im 87/116  soft-tissue]
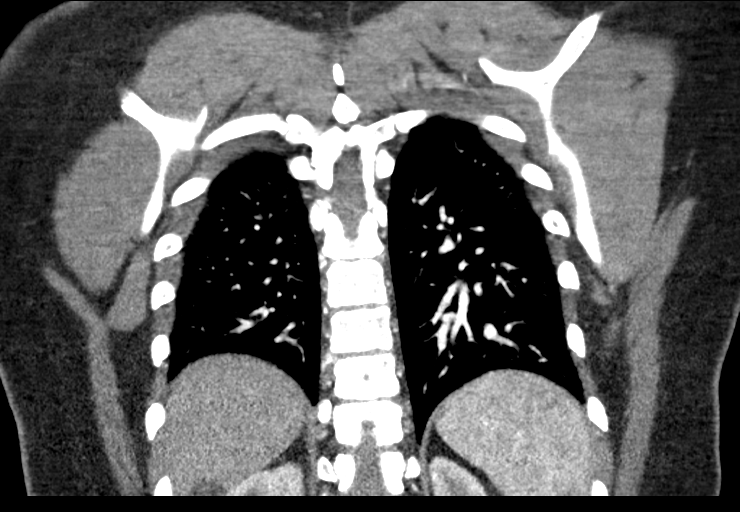

[17 of 46 positions shown; findings below may reference images not displayed]

FINDINGS: Examination is degraded secondary to patient body habitus and
associated quantum mottle artifact.

Vascular Findings:

There is adequate opacification of the pulmonary arterial system
with the main pulmonary artery measuring 349 Hounsfield units. There
are no discrete filling defects within the pulmonary arterial tree
to suggest pulmonary embolism. Normal caliber of the main pulmonary
artery.

Borderline cardiomegaly.  No pericardial effusion.

No definite thoracic aortic aneurysm or dissection on this nongated
examination. Conventional configuration of the aortic arch. The
branch vessels of the aortic arch appear widely patent throughout
their imaged course.

Review of the MIP images confirms the above findings.

----------------------------------------------------------------------------------

Nonvascular Findings:

Mediastinum/Lymph Nodes: No bulky mediastinal, hilar or axillary
lymphadenopathy. There is a minimal amount of ill-defined tissue
within the anterior mediastinum which is favored to represent
residual thymus tissue.

Lungs/Pleura: Minimal dependent ground-glass atelectasis. No
discrete focal airspace opacities. No pleural effusion or
pneumothorax. Central pulmonary airways appear widely patent.

No discrete pulmonary nodules. A punctate (approximately 0.3 cm)
perifissural lymph node is seen within the superior aspect of the
right major fissure (image 37, series 6).

Upper abdomen: Limited early arterial phase evaluation of the upper
abdomen demonstrates a hypoattenuating (6 Hounsfield unit) lesion
within the posterior aspect the right lobe of the liver,
incompletely imaged, though likely represents a hepatic cyst (image
37, series 4; coronal image 89, series 7).

Musculoskeletal: No acute or aggressive osseous abnormalities.
Regional soft tissues appear normal. Normal appearance of the
thyroid gland.
IMPRESSION: No acute cardiopulmonary disease. Specifically, no evidence of
pulmonary embolism.
